# Patient Record
Sex: Female | Born: 1935 | Race: White | Hispanic: No | Smoking: Never smoker
Health system: Southern US, Community
[De-identification: ages and names within clinical notes are randomized; demographics above are authoritative.]

## PROBLEM LIST (undated history)

## (undated) DIAGNOSIS — I50810 Right heart failure, unspecified: Secondary | ICD-10-CM

## (undated) DIAGNOSIS — I82409 Acute embolism and thrombosis of unspecified deep veins of unspecified lower extremity: Secondary | ICD-10-CM

## (undated) DIAGNOSIS — N289 Disorder of kidney and ureter, unspecified: Secondary | ICD-10-CM

## (undated) DIAGNOSIS — R601 Generalized edema: Secondary | ICD-10-CM

## (undated) DIAGNOSIS — E785 Hyperlipidemia, unspecified: Secondary | ICD-10-CM

## (undated) DIAGNOSIS — I10 Essential (primary) hypertension: Secondary | ICD-10-CM

## (undated) DIAGNOSIS — I639 Cerebral infarction, unspecified: Secondary | ICD-10-CM

## (undated) DIAGNOSIS — I4891 Unspecified atrial fibrillation: Secondary | ICD-10-CM

## (undated) DIAGNOSIS — K219 Gastro-esophageal reflux disease without esophagitis: Secondary | ICD-10-CM

## (undated) DIAGNOSIS — N390 Urinary tract infection, site not specified: Secondary | ICD-10-CM

## (undated) DIAGNOSIS — A499 Bacterial infection, unspecified: Secondary | ICD-10-CM

## (undated) DIAGNOSIS — K589 Irritable bowel syndrome without diarrhea: Secondary | ICD-10-CM

## (undated) DIAGNOSIS — R42 Dizziness and giddiness: Secondary | ICD-10-CM

## (undated) DIAGNOSIS — N189 Chronic kidney disease, unspecified: Secondary | ICD-10-CM

## (undated) HISTORY — DX: Acute embolism and thrombosis of unspecified deep veins of unspecified lower extremity: I82.409

## (undated) HISTORY — DX: Dizziness and giddiness: R42

## (undated) HISTORY — DX: Unspecified atrial fibrillation: I48.91

## (undated) HISTORY — DX: Cerebral infarction, unspecified: I63.9

## (undated) HISTORY — DX: Chronic kidney disease, unspecified: N18.9

## (undated) HISTORY — PX: ABDOMINAL HYSTERECTOMY: SHX81

## (undated) HISTORY — DX: Hyperlipidemia, unspecified: E78.5

## (undated) HISTORY — DX: Right heart failure, unspecified: I50.810

## (undated) HISTORY — DX: Urinary tract infection, site not specified: N39.0

## (undated) HISTORY — DX: Bacterial infection, unspecified: A49.9

## (undated) HISTORY — DX: Generalized edema: R60.1

## (undated) HISTORY — DX: Essential (primary) hypertension: I10

## (undated) HISTORY — DX: Gastro-esophageal reflux disease without esophagitis: K21.9

## (undated) HISTORY — DX: Irritable bowel syndrome without diarrhea: K58.9

## (undated) HISTORY — DX: Disorder of kidney and ureter, unspecified: N28.9

---

## 2001-04-12 ENCOUNTER — Encounter: Payer: Self-pay | Admitting: Orthopedic Surgery

## 2001-04-13 ENCOUNTER — Inpatient Hospital Stay (HOSPITAL_COMMUNITY): Admission: RE | Admit: 2001-04-13 | Discharge: 2001-04-16 | Payer: Self-pay | Admitting: Orthopedic Surgery

## 2001-04-16 ENCOUNTER — Inpatient Hospital Stay (HOSPITAL_COMMUNITY)
Admission: RE | Admit: 2001-04-16 | Discharge: 2001-04-23 | Payer: Self-pay | Admitting: Physical Medicine & Rehabilitation

## 2001-04-16 ENCOUNTER — Encounter: Payer: Self-pay | Admitting: Physical Medicine & Rehabilitation

## 2002-08-18 HISTORY — PX: TOTAL KNEE ARTHROPLASTY: SHX125

## 2003-06-16 ENCOUNTER — Ambulatory Visit (HOSPITAL_COMMUNITY): Admission: RE | Admit: 2003-06-16 | Discharge: 2003-06-16 | Payer: Self-pay | Admitting: Specialist

## 2003-07-12 ENCOUNTER — Inpatient Hospital Stay (HOSPITAL_COMMUNITY): Admission: RE | Admit: 2003-07-12 | Discharge: 2003-07-16 | Payer: Self-pay | Admitting: Specialist

## 2003-08-19 HISTORY — PX: BACK SURGERY: SHX140

## 2004-04-17 ENCOUNTER — Inpatient Hospital Stay (HOSPITAL_COMMUNITY): Admission: AD | Admit: 2004-04-17 | Discharge: 2004-04-24 | Payer: Self-pay | Admitting: Specialist

## 2004-04-22 ENCOUNTER — Ambulatory Visit: Payer: Self-pay | Admitting: Physical Medicine & Rehabilitation

## 2004-04-23 ENCOUNTER — Encounter (INDEPENDENT_AMBULATORY_CARE_PROVIDER_SITE_OTHER): Payer: Self-pay | Admitting: Cardiology

## 2004-04-24 ENCOUNTER — Inpatient Hospital Stay (HOSPITAL_COMMUNITY)
Admission: RE | Admit: 2004-04-24 | Discharge: 2004-05-01 | Payer: Self-pay | Admitting: Physical Medicine & Rehabilitation

## 2004-04-24 ENCOUNTER — Ambulatory Visit: Payer: Self-pay | Admitting: Physical Medicine & Rehabilitation

## 2004-06-06 ENCOUNTER — Encounter
Admission: RE | Admit: 2004-06-06 | Discharge: 2004-09-04 | Payer: Self-pay | Admitting: Physical Medicine & Rehabilitation

## 2004-06-10 ENCOUNTER — Ambulatory Visit: Payer: Self-pay | Admitting: Physical Medicine & Rehabilitation

## 2004-09-09 ENCOUNTER — Encounter
Admission: RE | Admit: 2004-09-09 | Discharge: 2004-12-08 | Payer: Self-pay | Admitting: Physical Medicine & Rehabilitation

## 2004-09-11 ENCOUNTER — Ambulatory Visit: Payer: Self-pay | Admitting: Physical Medicine & Rehabilitation

## 2005-07-22 IMAGING — CT CT RECONSTRUCTION
3 of 8 series · 6 of 20 positions shown, 7 images · IV contrast (agent unspecified)
Comparison: none

CLINICAL DATA: Weakness with eversion of the left foot.  History of fusion at L4-5.
 CT SCAN OF THE LUMBAR SPINE WITHOUT CONTRAST:
 Spiral scanning is performed from mid T10 to the sacrum.
 T10-11:  Degeneration of the disk but no suspicion of herniation or stenosis.
 T11-12:  Degeneration of the disk without evidence of herniation or canal stenosis.  Mild neural foraminal narrowing on the left.
 T12-L1:  Advanced degenerative disk disease with vacuum phenomenon and end-plate sclerotic changes.  Retrolisthesis of T12 on L1 of 2 mm.  No significant narrowing of the canal suspected.  Mild neural foraminal narrowing bilaterally, more on the left than the right.
 L1-2:  No disk pathology.  The canal and foramen are widely patent.
 L2-3:  The disk is degenerated and shows a circumferential protrusion eccentrically more prominent towards the left.  The facets and ligaments are hypertrophic.  There is moderate stenosis at this level, most severe in the left lateral recess.
 L3-4:  The patient has a unilateral pars defect on the right.  The pars interarticularis on the left is intact.  There is no anterolisthesis.  The disk shows degenerative change with disk space narrowing and vacuum phenomenon.  There are posteriorly projecting osteophytes.  The facets and ligaments are hypertrophic.  There is artifact at this level from the fusion material at L4-5 but I think there is probably severe spinal stenosis.
 L4-5:  The patient has had posterior decompression with bilateral pedicle screw and posterior rod fixation.  The pedicle screws appear well positioned and I don?t see any gross evidence of loosening.  The central canal is well decompressed.  I don?t see any neural foraminal compromise.
 L5-S1:  There is hypertrophic degenerative facet disease bilaterally, more on the right than on the left.  There is anterolisthesis of L5 on S1 of 2 mm.  The disk bulges mildly.  I don?t think there is any significant stenosis.  I think the L5 nerve roots can exit freely.
 IMPRESSION
 1.  Satisfactory appearance at the fusion level of L4-5.
 2.  Severe spinal stenosis at L3-4 because of degenerative disk disease and hypertrophic degenerative disease of the facets and ligaments.
 3.  Moderate stenosis at L2-3 particularly in the lateral recess, left worse than right, due to protruding disk material and facet ligamentous hypertrophy. 
 CT MULTIPLANAR REFORMATION:
 Sagittal, coronal, and paraxial reformations are done which aid in depiction of the above described findings.

[Series 2: l-spine helical · axial · 0.31mm/px · z∈[-216,-144]mm · 2 of 88 slices shown, 3 images]
[im 30/88  soft-tissue]
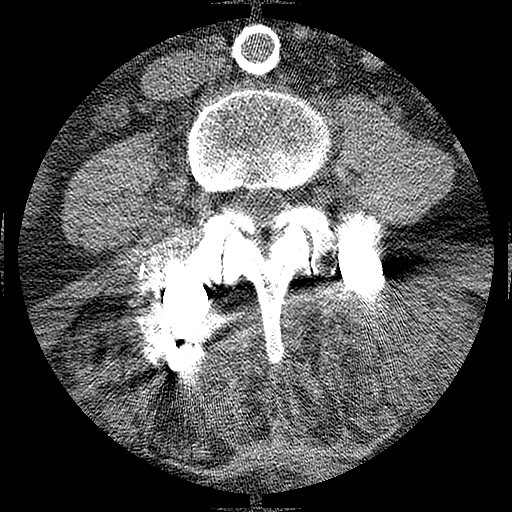
[im 30/88  bone]
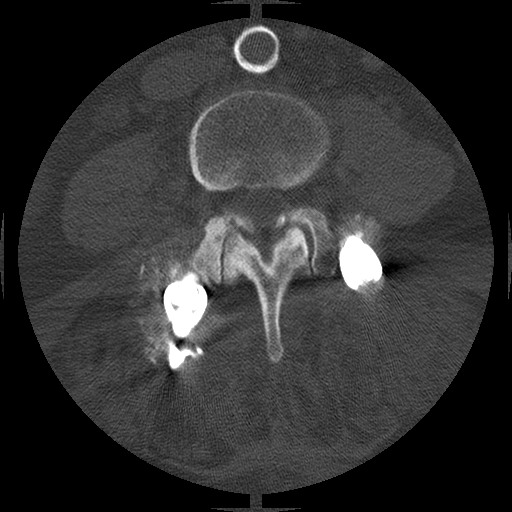
[im 59/88  bone]
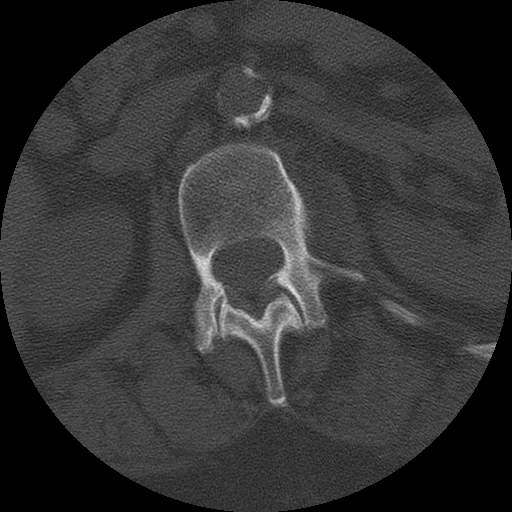

[Series 3: recon 2: l-spine helical · axial · 0.29mm/px · z∈[-216,-144]mm · 2 of 88 slices shown]
[im 30/88  bone]
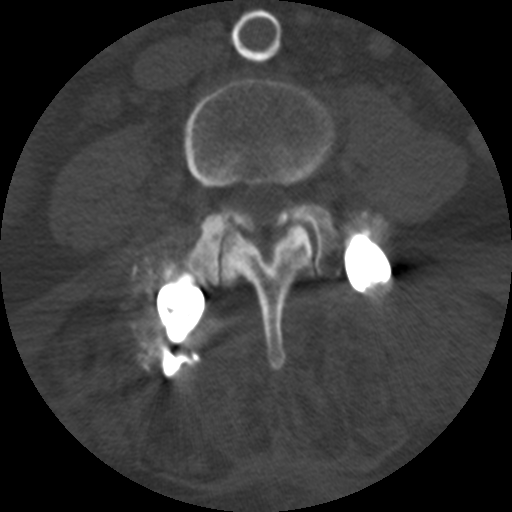
[im 59/88  bone]
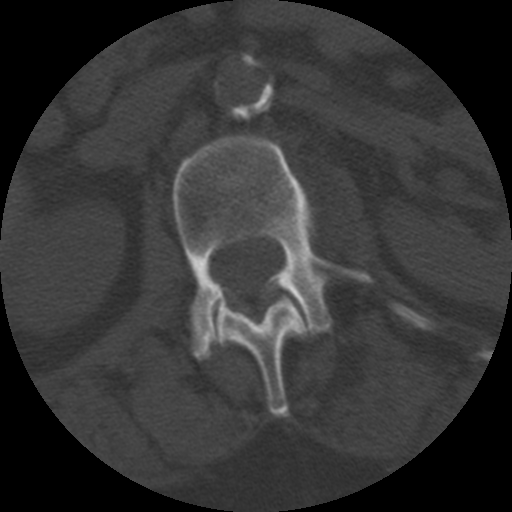

[Series 4: recon 3: l-spine helical · axial · 0.31mm/px · z∈[-216,-144]mm · 2 of 88 slices shown]
[im 30/88  bone]
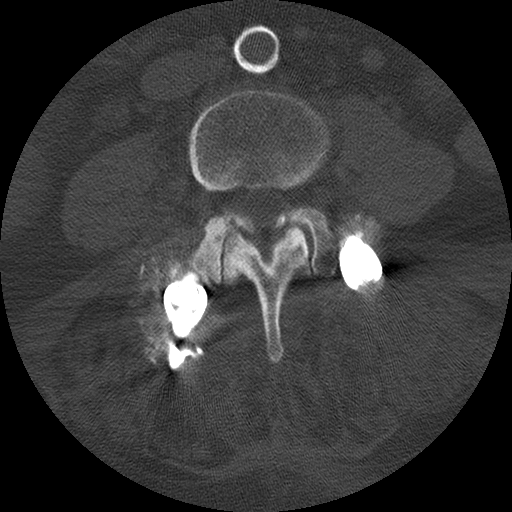
[im 59/88  bone]
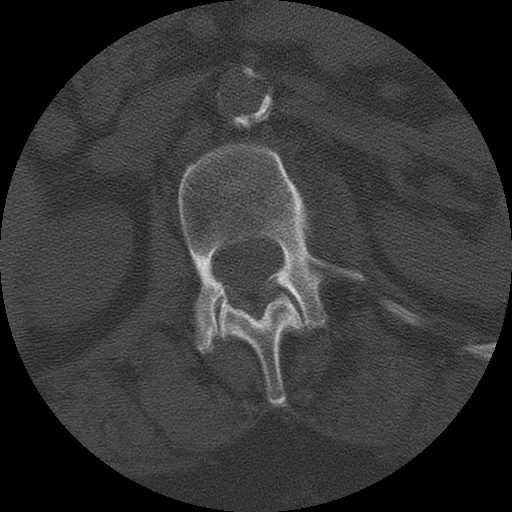

[6 of 20 positions shown; findings below may reference images not displayed]

## 2009-10-02 ENCOUNTER — Ambulatory Visit: Payer: Self-pay | Admitting: Cardiovascular Disease

## 2009-10-23 ENCOUNTER — Telehealth: Payer: Self-pay | Admitting: Cardiovascular Disease

## 2010-01-31 ENCOUNTER — Ambulatory Visit: Payer: Self-pay | Admitting: Vascular Surgery

## 2010-09-17 NOTE — Progress Notes (Signed)
  Phone Note Outgoing Call   Call placed by: Dessie Coma LPN Call placed to: Patient Summary of Call: Patient notified per Dr. Freida Busman, stress test was normal and she should see her neurologist, Dr. Sandria Manly, to f/u on her dizziness.

## 2010-12-31 NOTE — Assessment & Plan Note (Signed)
[facility]                        [place] [facility] OFFICE NOTE   NAME:Fogleman, KATHIA COVINGTON                        MRN:          [id number]  DATE:10/02/2009                            DOB:          09/05/1927    CHIEF COMPLAINT:  Dizziness.   HISTORY OF PRESENT ILLNESS:  Ms. [name] [name] is a 77 year old white female with  past medical history significant for multiple CVAs, hypertension who is  presenting with worsening dizziness.  The patient states that she was  diagnosed with stroke several years ago.  She initially did not have any  dizziness and has felt over the past year or two.  She believes that has  been worse over the past several months.  She states that the dizziness  usually occurs when she stands from a lying or seated position.  She has  had one episode in which she passed out upon standing.  It usually  happens within seconds after standing.  She denies any chest discomfort  or chest pain.  She does endorse increasing dyspnea on exertion to the  point where she can barely walk to her mailbox and back.  She had an  echocardiogram in October 2010, that showed normal ejection fraction and  no significant valvular abnormalities.  She states that the shortness of  breath has been present for over a year but is clearly worse the past  few months.  She denies any previous ischemia or any prior ischemia  evaluation.   PAST MEDICAL HISTORY:  As above in HPI.   SOCIAL HISTORY:  No tobacco.  No alcohol.   FAMILY HISTORY:  Negative for premature coronary artery disease.   ALLERGIES:  The patient has no known drug allergies.   MEDICATIONS:  1. Amlodipine 5 mg daily.  2. Carvedilol 12.5 mg twice a day.  3. Losartan 100 mg daily.  4. Pantoprazole 40 mg daily.  5. Lasix 20 mg daily.  6. Fish oil 1000 mg daily.  7. Ecotrin 81 mg daily.  8. Vitamin D3 2000 IU daily.   REVIEW OF SYSTEMS:  As in HPI.  The patient also endorses some chronic  back and  lower extremity pain.  She also endorses weakness in her lower  extremities since her back surgery.  The patient also notices chronic  edema.  Other systems as in HPI, otherwise negative.   PHYSICAL EXAMINATION:  VITAL SIGNS:  Her blood pressure is 181/82 in the  right arm, 182/77 in the left arm, pulse 68, sating 98% room air.  She  weighs 181 pounds.  Orthostatics were taken here in the office and her  heart rate went from 70 to 84 upon standing.  However, her blood  pressure increased from 152/74 in the lying position, increased  sequentially from sitting to standing in 0, 2, and 5 minutes to  eventually being 199/85.  The patient was symptomatic.  She endorsed  dizziness and shakiness during this time.  GENERAL:  She is no acute distress.  HEENT:  Normocephalic, atraumatic.  NECK:  Supple.  There is no JVD.  There are no carotid bruits.  HEART:  Regular rate and rhythm without murmur, rub, or gallop.  LUNGS:  Clear bilaterally.  ABDOMEN:  Soft, nontender, nondistended.  EXTREMITIES:  1+ bilateral lower extremity edema.  SKIN:  Warm and dry.  PSYCHIATRIC:  The patient is appropriate.   EKG taken today in the office demonstrates normal sinus rhythm with an  incomplete right bundle-branch block.  I reviewed the patient's  echocardiogram, her ejection fraction was 60%, she had aortic valve  sclerosis, peak pulmonary artery pressure of 22 mmHg, and there is mild  tricuspid regurgitation.  MRI of the spine showed multilevel  multifactorial central canal stenosis.  This is most severe at L2-L3 and  L4-L5.  MRI of the brain dated November 2010, there is evidence old  parietal infarctions larger on the left than on the right.  There is  chronic small vessel disease elsewhere.  No acute process.  Ultrasound  of the right lower extremity was negative for DVT in April 2010.  The  patient had a recent blood work drawn at Dr. Donita Brooks office and is  not currently available.   ASSESSMENT:   This is a 77 year old female with a history of  cerebrovascular accidents presenting with dizziness.  I do not think  that the dizziness is neurocardiogenic in origin.  Here in the office,  she was clearly symptomatic with blood pressures that were increasing.  Her dizziness is more likely to be secondary to the CVA's, she has  suffered in the past.  The patient is having dyspnea on exertion of  unclear etiology; it may be secondary to diastolic dysfunction in the  setting of uncontrolled hypertension.  However it could also represent  ischemia.   PLAN:  The patient is on a beta-blocker and this may be contributing to  the dizziness.  Today, we will give her a trial of 7 days of  Bystolic  to take instead of the Coreg.  If the patient's symptoms improve, we  will switch her, if they do not she can resume taking the Coreg.  Because of the increased dyspnea on exertion, we will proceed with  Lexiscan Cardiolite to rule out inducible ischemia.  We advised the  patient make an point with her neurologist, Dr. Sandria Manly who she has not  seen in approximately 2 years for further evaluation and possible  treatment of these symptoms.     Brayton El, MD  Electronically Signed    SGA/MedQ  DD: 10/02/2009  DT: 10/03/2009  Job #: [id number]

## 2010-12-31 NOTE — Procedures (Signed)
DUPLEX DEEP VENOUS EXAM - LOWER EXTREMITY   INDICATION:  Chronic right lower extremity edema, venous insufficiency.   HISTORY:  Edema:  Right lower extremity for over 1 year.  Trauma/Surgery:  History of knee replacement.  Pain:  No.  PE:  No.  Previous DVT:  No.  Anticoagulants:  Other:   DUPLEX EXAM:                CFV   SFV   PopV  PTV    GSV                R  L  R  L  R  L  R   L  R  L  Thrombosis    o  o  o     o     o      o  Spontaneous   +  +  +     +     +      +  Phasic        +  +  +     +     +      +  Augmentation  +  +  +     +     +      +  Compressible  +  +  +     +     +      +  Competent     o  o  +     +     +      +   Legend:  + - yes  o - no  p - partial  D - decreased   IMPRESSION:  1. No evidence of deep vein thrombosis noted in the right lower      extremity.  2. Mild reflux noted, focally, at the bilateral common femoral vein      levels.    _____________________________  Di Kindle. Edilia Bo, M.D.   CH/MEDQ  D:  02/01/2010  T:  02/01/2010  Job:  [id number]

## 2010-12-31 NOTE — Consult Note (Signed)
NEW PATIENT CONSULTATION   [name] [name], [name] [name]  DOB:  21-May-1928                                       01/31/2010  EAVWU#:[id number]   I saw the patient in the office today in consultation concerning  bilateral leg swelling.  She was referred by Dr. Sudie Bailey.  This is a  pleasant 77 year old woman who states that she has had swelling in both  legs for approximately 2 years now.  Over the last 6 months her swelling  has gradually progressed and is more significant on the right side.  Of  note, she underwent a total knee replacement on the right in  approximately 2005.  She cannot remember the exact date.  She has had no  previous history of DVT or phlebitis.  She has had a previous  hysterectomy but has not no other abdominal or inguinal surgery or  radiation therapy.  She states that the swelling is best in the morning  when she wakes up after she has been sleeping and increases gradually  during the day as she is on her feet.  She has minimal pain associated  with the swelling.  She has had no fever or chills.   Her past medical history is significant for hypertension.  She denies  any history of diabetes, hypercholesterolemia, history of previous  myocardial infarction, history of congestive heart failure or history of  COPD.  She did apparently had a stroke in the remote past but cannot  remember the details.  She also has a history of sleep apnea.   PAST SURGICAL HISTORY:  Significant for previous total right knee  replacement.  She has also had a lumbar fusion and a hysterectomy.   FAMILY HISTORY:  There is no history of premature cardiovascular  disease.   SOCIAL HISTORY:  She is widowed.  She has two children.  She does not  smoke cigarettes.  She does not drink alcohol.   REVIEW OF SYSTEMS:  She has had some mild weight loss over the last 6  months.  She has had no fever.  CARDIOVASCULAR:  She has had no chest pain, chest pressure, palpitations  or  arrhythmias.  She does admit to dyspnea on exertion.  She has had no  claudication or rest pain.  She has had no history of TIA or amaurosis  fugax.  GI:  She has a history of reflux and occasional problems swallowing.  NEUROLOGIC:  She has occasional dizziness.  MUSCULOSKELETAL:  She has some arthritis and joint pain.  Pulmonary, GU, psychiatric, ENT, hematologic review of systems is  unremarkable and is documented on the medical history form in her chart.   PHYSICAL EXAMINATION:  General:  This is a pleasant 77 year old woman  who appears her stated age.  Vital signs:  Blood pressure is 167/67,  heart rate is 75, saturation 98%.  HEENT:  Unremarkable.  Lungs:  Are  clear bilaterally to auscultation without rales, rhonchi or wheezing.  Cardiovascular:  I do not detect any carotid bruits.  She has a regular  rate and rhythm.  She has palpable femoral pulses and palpable pedal  pulses on the right.  I cannot palpate pedal pulses on the left arm.  She has brisk Doppler signals in the left foot.  She has palpable  popliteal pulses.  Abdomen:  Soft and normal with normal  pitched bowel  sounds.  No masses are appreciated.  Musculoskeletal:  There are no  major deformities or cyanosis.  She does have mild left lower extremity  swelling with more significant right lower extremity swelling mostly  from the knee down.  This is nonpitting edema.  Neurological:  She has  no focal weakness or paresthesias.  Skin:  There are no ulcers or  rashes.   I did order and independently interpret her venous duplex scan today  which shows no evidence of DVT in the right lower extremity.  She had  some mild reflux in the common femoral veins bilaterally.  Reviewed her  laboratory evaluation which shows a creatinine of 1.28.  White blood  cell count is 7.8.   Based on her history and exam I think she has bilateral lymphedema more  significant on the right side.  She could have some mild underlying  chronic  venous insufficiency.  However I have explained that the  treatment for this would be the same.  We have discussed the importance  of intermittent leg elevation and the proper positioning for this.  In  addition, I have written a prescription for a compression stocking with  a mild gradient of 15-20 mmHg.  She understands that this is a chronic  problem and it is important to try to stay on top of this with daily leg  elevation and compression stockings as needed.  I will be happy to see  her back at any time if any new vascular issues arise.     Di Kindle. Edilia Bo, M.D.  Electronically Signed   CSD/MEDQ  D:  01/31/2010  T:  02/01/2010  Job:  [id number]   cc:   Philemon Kingdom

## 2011-01-03 NOTE — Discharge Summary (Signed)
[name] [name], [name] [name]                 ACCOUNT NO.:  [id number]   MEDICAL RECORD NO.:  [id number]          PATIENT TYPE:  INP   LOCATION:  5010                         FACILITY:  [facility]   PHYSICIAN:  Jene Every, M.D.    DATE OF BIRTH:  06/15/1928   DATE OF ADMISSION:  04/17/2004  DATE OF DISCHARGE:  04/24/2004                                 DISCHARGE SUMMARY   ADMISSION DIAGNOSES:  1.  Weakness in the lower extremities.  2.  Status post right total knee arthroplasty.  3.  Hysterectomy.  4.  Status post lumbar fusion.  5.  Hypertension.  6.  Hyperlipidemia.  7.  Recent urinary tract infection.   DISCHARGE DIAGNOSES:  1.  Lower extremity weakness, status post cerebrovascular accident.  2.  Status post right total knee arthroplasty.  3.  Hysterectomy.  4.  Status post lumbar fusion.  5.  Hypertension.  6.  Hyperlipidemia.  7.  Recent urinary tract infection.   CONSULTATIONS:  PT, OT, rehabilitation, urology.   PROCEDURE:  MRI of the lumbar spine, CT of the head, carotid dopplers.   HISTORY OF PRESENT ILLNESS:  [name] [name] is a 77 year old female who was seen  in the acute care clinic by Dr. Marlowe Kays with complaints of falling  several days ago.  She states she was approaching the door, fell backwards,  and landed on her left side.  The patient was initially seen at [facility] in the emergency room.  The patient states that she had a CT done  which was normal.  She was then sent home.  However, the patient fell again  last evening in her kitchen.  She states now she is unable to walk.  Both  her legs just collapse underneath her.  Her son also points out that her  left foot tends to turn inward.   At this point, Dr. Fayrene Fearing [place] admitted the patient straight from clinic  to the hospital under Dr. Glade Lloyd service for observation and follow  up imaging studies.   LABORATORY DATA:  Admission labs showed a normal white cell count of 8.9,  hemoglobin of  12.8, hematocrit 37.9 with normal differential.  Sedimentation  rate was 13.  Routine chemistries obtained showed a low potassium of 3.0  with a sodium of 141, slightly elevated glucose with a maximum of 135.  Potassium was repeated at the time of discharge and was low at 3.7.  Routine  liver function tests showed slightly elevated AST of 41, hemoglobin A1C of  5.5, homocystine of 16.9.  A lipid profile was obtained which showed  elevated total cholesterol at 222, triglycerides of 306, HDL of 41, LDL of  120.   Angiography of the brain was done and showed extensive atherosclerotic  disease above the anterior and posterior circulation, particularly distal  internal carotid arteries and proximal middle cerebral artery.  There is  suspicion for focal dissection, distal aspect of basilar artery.  Neck  angiography was also performed which showed bilateral vertebral artery  stenoses, at least moderate was noted on the right.  Head CT  was negative  for bleed or acute intracranial process.  Old left distribution infarct was  noted.  X-rays of the cervical spine showed degenerative disc disease of  C5/6 and C6/7.  CT of the lumbar spine showed severe stenosis at L3/4,  moderate stenosis of L2/3 with good fusion at L4/5.  Echocardiogram was also  performed which showed left ventricular systolic function was normal.  Ejection fraction was estimated at 55-65%.  Left atrium was mildly dilated.   HOSPITAL COURSE:  The patient was admitted for observation with further  imaging studies.  A CT of the lumbar spine was ordered.  Hypokalemia was  treated.  PT and OT was consulted.  The patient was fitted with a  ____________ Levonne Hubert.  The patient noted some slight improvement with  application of brace.  Due to negative results of her CT of the lumbar  spine, neurology was consulted for a possible CVA.  A CT of the head showed  questionable old infarct but no acute process was noted.  Neurology followed   along and ordered continued testing.   There is evidence of new right brain stroke.  This was treated accordingly  through neurology.  They ran continued test on the patient.  Rehabilitation  was consulted for continued care.  Medications were adjusted per neurology.  Once they felt she was stable, she was then transferred to the  rehabilitation unit for continued care.   DISPOSITION:  Transferred to rehabilitation for continued care and physical  therapy.  The patient is to follow up with his neurologist as recommended.   DISCHARGE MEDICATIONS:  Resume home medications and any additional  medications that were prescribed during her hospitalization.   CONDITION ON DISCHARGE:  Stable and improved.   DISCHARGE DIAGNOSIS:  Status post new right brain stem infarct.      Chri   CS/MEDQ  D:  05/28/2004  T:  05/28/2004  Job:  [id number]

## 2011-01-03 NOTE — Assessment & Plan Note (Signed)
HISTORY:  The patient is back regarding her right posterior parietal stroke.  We talked about outpatient therapy at the last visit in October, and she  attended this and just finished up two weeks ago.  She feels that it has  helped her balance, but balance is still a major problem.  She uses her cane  when she is up ambulating.  She also has shortness of breath still when  walking.  It is usually after 100 to 200 feet or more.  She had a workup by  her primary care doctor for the shortness of breath, who found no  abnormalities.   Her medications appear to be generally the same today.  She has done some  driving and has been able to do so without any significant problems.  The  patient does complain of some low back pain due to her chronic back history,  including prior surgery.  She rates her pain at a 2 to 3/10 on average.  She  takes Tylenol for relief, which helps to a certain extent.  The pain is made  worse with walking and is relieved with rest.   SOCIAL HISTORY:  The patient lives alone and is widowed.   REVIEW OF SYSTEMS:  The patient reports shortness of breath with exertion,  problems with sleep, but denies any bowel or bladder incontinence, or any  genitourinary complaints.  Denies any problems with weight changes, skin  breakdown, rash, or bruising.  Her mood has been good.   PHYSICAL EXAMINATION:  VITAL SIGNS:  Blood pressure 186/63, pulse 74,  respirations 16.  She is saturating 97% on room air.  GENERAL:  The patient walks with her cane, and tended to lose her balance  more so to the right today, but occasionally did so to the left.  Her loss  of balance was only mild, and the patient seemed to be able to correct  herself rather quickly.  Her affect was bright and her appearance was well-  kempt.  NEUROLOGIC:  Strength was minimally decreased on the left side at 4/5 at the  leg.  She had some decreased sensory function there, as well as some overall  decrease in fine  motor coordination.  Upper extremity strength was near  normal, with probably decreased fine motor coordination, left greater than  right.  Cranial nerve examinations were grossly intact, with good intact  visual fields.  Cognition was generally appropriate.  HEART:  A regular rate and rhythm.  LUNGS:  Clear.  ABDOMEN:  Soft, nontender.   ASSESSMENT:  1.  Status post left posterior parietal cerebrovascular accident.  2.  Status post right total knee replacement.   PLAN:  1.  The patient is fairly stable at this point.  I recommend continued cane      use.  I would like to see her work on her cardiovascular endurance, as      this likely may be her real true problem, as related to her shortness of      breath.  I have recommended going to the [facility] for some scheduled      classes, as well as a generalized program.  She would do well on a      stationary bike and with some aquatic activities.  2.  The patient may continue to drive.  3.  I have nothing further to offer today for this patient.  If her back      pain should persist or worsen, could follow  up with the patient here in our office.  I did give her some Lidoderm      patches to try today for her low back, to see how she liked these.  She      was given #30, 5% Lidoderm patches today.      ZTS/MedQ  D:  09/11/2004 10:34:01  T:  09/11/2004 11:25:50  Job #:  [id number]   cc:   [place]esto Charon, M.D.  [place], [place]

## 2011-01-03 NOTE — Op Note (Signed)
[name], [name]                           ACCOUNT NO.:  [id number]   MEDICAL RECORD NO.:  [id number]                   PATIENT TYPE:  INP   LOCATION:  5032                                 FACILITY:  [facility]   PHYSICIAN:  Jene Every, M.D.                 DATE OF BIRTH:  [birth year]   DATE OF PROCEDURE:  07/12/2003  DATE OF DISCHARGE:                                 OPERATIVE REPORT   PREOPERATIVE DIAGNOSIS:  Spondylolisthesis, L4-5, and spinal stenosis.   POSTOPERATIVE DIAGNOSIS:  Spondylolisthesis, L4-5, and spinal stenosis.   PROCEDURES PERFORMED:  1. Gill laminectomy of L4.  2. Decompression of L4 and L5 nerve roots bilaterally, decompression of L3-4     centrally as well.  3. Instrumentation, L4-5, utilizing pedicle screw instrumentation.  4. Lateral mass fusion utilizing autologous and Symphony bone graft with     Nuvasive free-running electromyographic monitoring for three hours.   ASSISTANT:  __________ .   BRIEF HISTORY AND INDICATIONS:  A 77 year old with spondylolisthesis at L4-  5, severe stenosis, right lower extremity radicular pain.  Operative  intervention was indicated for decompression of L4-5 and stabilization with  lateral mass fusion and possible instrumentation.  Risks and benefits  discussed, including bleeding, infection, damage to neurovascular  structures, CSF leakage, epidural fibrosis, adjacent segment disease, etc.   TECHNIQUE:  Patient in supine position on the Wilson frame, all bony  prominences well-padded and the lumbar region prepped and draped in the  usual sterile fashion.  We used free-running neuro monitoring. An incision  was made from the spinous process of L3 to S1, the subcutaneous tissue was  dissected with electrocautery utilized to achieve hemostasis.  The dorsal  lumbar fascia identified and divided in line with the skin incision.  Paraspinous muscle elevated from the lamina of L4, L5, and S1.  Confirmatory  radiographs obtained  of the L3-4 spinous process and secondarily at L4-5.  The McCullough retractor was placed.  The spinous processes of L4 and L5 and  partially of L3 were removed.  Subcutaneous tissue was __________ ,  electrocautery was utilized to achieve hemostasis.  Entered the interlaminar  space with a 2 mm Kerrison.  Severe stenosis was noted.  This was enlarged  with a 3 mm Kerrison, performing a decompression.  We used osteotomes  laterally at the facet joints.  The Gill fragments were removed.  Superior  articulating facets bilaterally were undercut with the 2 and 3 mm Kerrison,  severe stenosis noted bilaterally, particularly on the right in comparison  to the left.  Foraminotomies of L4 and L5 were performed.  We also then  decompressed and undercut the lamina of L3 and L3-4.  There was an  instability noted.  She had good bone quality.  Therefore, felt that pedicle  screw instrumentation would be appropriate to stabilize L4-5.  Using x-ray  we found the pedicle of L5 and  L4 utilizing an awl and the intersection of  the transverse process and the outer portion of the facet.  Using the  appropriate angulation and convergence, the pedicle probe was utilized to  enter the L4 and the L5 pedicle on the left, then followed by the right.  We  tested it with a neural probing and visualized in the AP and lateral plane  and x-rayed.  Also tested with EMG probe, found to be satisfactory.  We  tapped, and this was performed bilaterally.  A high-speed bur was used to  decorticate the L4 and L5 transverse processes and then the outer portion of  the pars.  Bone graft, autogenous and Symphony bone graft, was then placed  in the lateral recesses.  Forty millimeter 6.5 screws were inserted  bilaterally at L4 and L5 with excellent purchase.  A short 40 mm rod was  utilized to transfix the screws bilaterally with set screws and torqued to  60 foot pounds.  Excellent purchase was noted, and the AP and lateral plane   was found to be satisfactory, converging within the pedicle.  Just prior to  this we tested each of the pedicle screws and the EMG testing and they were  found to be satisfactory.  A hockey stick probe placed in the foramen of L4  and L5 , found to be widely patent following the decompression, and there  was no evidence of CSF leakage or active bleeding.  The wound copiously  irrigated, bipolar electrocautery was utilized to achieve hemostasis.  The  wound copiously irrigated with antibiotic irrigation.  A Hemovac was placed  and brought out through a lateral stab wound in the skin.  We had undercut  the lamina of L3 as well.   Next the dorsal lumbar fascia reapproximated with #1 Vicryl interrupted  figure-of-eight sutures, the subcutaneous tissue reapproximated with 2-0  Vicryl subcuticular sutures, and the skin was reapproximated with staples.  The wound was dressed sterilely.  Placed supine on the hospital bed,  extubated without difficulty, and transported to recovery.   The patient tolerated the procedure well with no complication.  Blood loss  was 650 mL.  She received two units of packed red blood cells  intraoperatively.                                               Jene Every, M.D.    [name] [name]  D:  07/12/2003  T:  07/13/2003  Job:  [id number]

## 2011-01-03 NOTE — H&P (Signed)
NAMELINDSEY, [name] [name]                           ACCOUNT NO.:  [id number]   MEDICAL RECORD NO.:  [id number]                   PATIENT TYPE:  INP   LOCATION:  5010                                 FACILITY:  [facility]   PHYSICIAN:  Jene Every, M.D.                 DATE OF BIRTH:  12/13/1927   DATE OF ADMISSION:  04/17/2004  DATE OF DISCHARGE:                                HISTORY & PHYSICAL   HISTORY OF PRESENT ILLNESS:  This 77 year old lady is seen in the acute care  clinic at [facility] office accompanied by her  son, Jillyn Hidden.  She and he give a history of her falling this past Monday.  She  apparently was approaching a door and fell backwards to the side on the  left.  She was taken to the emergency room at [facility].  CT  scan was done there, which was normal.  She was sent home after that.  She  fell again last evening.  She was in the kitchen area using her walker and  fell backwards.  She states now that she cannot walk as her legs collapse.  Also, her son as well as she has pointed out that her left foot turns  inward.  She essentially steps on her right foot with her left foot when she  ambulates.  She essentially cannot function at home.  She was brought to our  practice today due to the fact that she had had surgery in October of last  year.  This was extensive lumbar surgery, concentrating on the L4-5 area.  The patient is admitted due to the fact that she cannot walk per report and  cannot function.  It is certainly unsafe for her to be at home.  She has had  no loss of bowel or bladder function.   PAST MEDICAL HISTORY:  Surgeries include a right total knee replacement  arthroplasty, hysterectomy, and L4-5 decompression and lumbar laminectomy  with fusion.  She currently is being treated for hypertension and  hyperlipidemia.  She is being treated for a UTI from a urinalysis which was  done at [facility] in [place].   CURRENT MEDICATIONS:  1.  Felodipine ER 10 mg one daily.  2.  Labetalol 200 mg 1-1/2 tablets b.i.d.  3.  Enalapril 10 mg one at h.s.  4.  Cephalexin 500 mg t.i.d. for the urinary tract infection.   She has no medical allergies.   Her physicians are in [place].   SOCIAL HISTORY:  The patient is unemployed and neither smokes nor drinks.   FAMILY HISTORY:  Noncontributory.   REVIEW OF SYSTEMS:  CENTRAL NERVOUS SYSTEM:  No seizure disorder or  paralysis, but the patient has pain in the left lower extremity and exhibits  weakness in the left lower extremity.  CARDIOVASCULAR:  She denies any chest  pain or angina.  RESPIRATORY:  No productive cough, no hemoptysis or  shortness of breath.  GASTROINTESTINAL:  No nausea, vomiting, melena, or  bloody stools.  GENITOURINARY:  No discharge, dysuria, or hematuria despite  her UTI.   PHYSICAL EXAMINATION:  GENERAL:  An alert, cooperative, fully-oriented 78-  year-old who is dressed, well-groomed, and sitting in a wheelchair  accompanied by her son, Jillyn Hidden.  VITAL SIGNS:  Blood pressure 150/62 seated, right arm.  Pulse 80 and  regular.  Respirations 14, unlabored.  HEENT:  Normocephalic, PERRLA.  EOM intact.  Oropharynx is clear.  CHEST:  Clear to auscultation.  No rhonchi, no rales.  CARDIAC:  Regular rate and rhythm, no murmurs are heard.  ABDOMEN:  Soft, nontender, liver and spleen not felt.  GENITALIA, RECTAL, PELVIC, BREASTS:  Not done, not pertinent to present  illness.  EXTREMITIES:  The patient does indeed hold her left foot internally rotated  in wheelchair.  She can, however, dorsiflex the foot at about 4/5 motor  strength.  She can evert the foot somewhat as well.  Straight leg raise is  negative on the left.   ADMISSION DIAGNOSES:  1.  Status post fusion of lumbar spine with most recent surgery to her back      in October 2004.  2.  Near-syncopal episodes.  3.  Hypertension.   PLAN:  The patient will be admitted for  observation, CT scan, and routine x-  rays of the thoracolumbar spine.  She will be admitted to Dr. Ermelinda Das  service.      Dooley L. Cherlynn June.                 Jene Every, M.D.    DLU/MEDQ  D:  04/18/2004  T:  04/18/2004  Job:  [id number]

## 2011-01-03 NOTE — Procedures (Signed)
[name]  Patient:    [name], [name] Visit Number: [id number] MRN: [id number]          Service Type: SUR Location: 3000 3033 01 Attending Physician:  Drema Pry Dictated by:   Guadalupe Maple, M.D. Proc. Date: 04/13/01 Adm. Date:  04/13/2001                             Procedure Report  PROCEDURE:  Lumbar epidural catheter placement for postoperative pain control.  ANESTHESIOLOGIST:  Guadalupe Maple, M.D.  INDICATION:  Ms. [name] [name] is a 77 year old white female who underwent a right total knee arthroplasty by Dr. Jearld Adjutant under general anesthesia. Prior to the procedure, the risks and benefits of epidural pain control were discussed with the patient and she agreed to proceed with this form of pain control following surgery.  DESCRIPTION OF PROCEDURE:  Upon completion of the procedure, while the patient was still under general endotracheal anesthesia, she was turned to the right lateral decubitus position and the low back was prepped and draped sterilely. Two attempts were made to enter the epidural space at the L3-4 interspace, which were unsuccessful due to an inability to pass between the bony laminae. A third attempt at L2-3 was successful using an 18-gauge Tuohy needle in the midline.  Loss-of-resistance technique with air was used.  Aspiration of the needle was negative for blood or CSF.  Ten cc of preservative-free saline to which were added 50 mg of 1% lidocaine and 100 mcg of Fentanyl were injected without difficulty.  A catheter was then threaded 4 cm into the epidural space and the needle was removed without difficulty.  The catheter was then taped securely in place and the patient was turned to the supine position and brought to the recovery room in stable condition.  She will then be begun on an epidural Fentanyl and Marcaine infusion and followed on a daily basis by the anesthesia service.  Dictated by:    Guadalupe Maple, M.D. Attending Physician:  Drema Pry DD:  04/13/01 TD:  04/14/01 Job: [id number]

## 2011-01-03 NOTE — Discharge Summary (Signed)
[place]. [facility]  Patient:    [name], [name] Visit Number: [id number] MRN: [id number]          Service Type: Attending:  Faith [name], M.D. Dictated by:   [name] [name], P.A. Adm. Date:  04/16/01 Disc. Date: 04/23/01   CC:         Dr. Modesto [name]  [name] [name], M.D.   Discharge Summary  DISCHARGE DIAGNOSES: 1. Status post right total knee arthroplasty. 2. Hypertension. 3. Anemia. 4. Osteoporosis.  HISTORY OF PRESENT ILLNESS:  The patient is a 77 year old, white female with past medical history of hypertension and CVA admitted on April 13, 2001, for right total knee arthroplasty secondary to end-stage OA by Dr. Doristine [name]. Postoperative complications included anemia secondary to blood loss and increased temperatures with blood pressure fluctuations.  The patient is presently not on any blood pressure medications.  She is on Coumadin for DVT prophylaxis.  PT report indicated that the patient was ambulating with minimal assistance 40 feet with standard walker.  She could transfer sit to stand with minimal to modified assist.  The patient is weightbearing as tolerated.  She received two units of packed red blood cells for anemia.  Primary M.D. is Dr. Modesto [name].  PAST MEDICAL HISTORY: 1. Hypertension. 2. Cardiovascular disease. 3. Hypercholesterolemia. 4. Depression. 5. Insomnia. 6. Diverticulitis. 7. CVA.  PAST SURGICAL HISTORY:  TAH.  MEDICATIONS: 1. Neurontin 300 mg one tablet b.i.d. 2. Captopril 25 mg one tablet t.i.d. 3. HCTZ 25 mg q.a.m. 4. Aspirin 325 mg q.d. 5. Os-Cal plus D two tablets daily.  SOCIAL HISTORY:  The patient is a widow and lives alone in [place] in one-level home.  She has two steps to entry.  She was independent prior to admission.  No alcohol or tobacco use.  Her son can assist and is very supportive.  REVIEW OF SYSTEMS:  Significant for increased blood pressure and joint pain.  HOSPITAL COURSE:   [name] [name] was admitted to rehabilitation on April 16, 2001, where she received at least three hours of PT/OT daily.  The first two days were significant for elevated temperatures, anemia, hyperkalemia and elevated blood pressures.  Due to increased temperatures, chest x-ray was ordered which was negative.  Blood cultures and urine cultures came back negative.  The patient was encouraged to push incentive spirometry.  Temperatures also began to normalize and urine cultures also came back negative.  The patient was transfused two units of packed red blood cells on Saturday on April 18, 2001, due to decreased hemoglobin.  The patient was also placed on Trinsicon p.o. b.i.d.  Due to the increased blood pressure, the patient was restarted on Lasix 25 mg p.o. q.d. and Captopril 25 mg p.o. t.i.d.  Blood pressure became to normalize.  The patient was started on K-Dur 20 mEq p.o. b.i.d. secondary to hypokalemia secondary to diuretics.  The patient remained on Coumadin for DVT prophylaxis with supplement of Lovenox 30 mg q.12h. until INR reached therapeutic level.  The patient also began to complain of night sweats and leg swelling.  Venous duplex was ordered on April 21, 2001, which came back negative for DVT and Bakers cyst.  Due to continued increased night sweats, a TSH level was ordered which was within normal limits.  We suspected that night sweats were caused from the pain medication.  The patient made very good progress while in rehabilitation.  Latest labs indicated that the patients hemoglobin was 10.7, hematocrit 31.1, white count 7.7.  Sodium was 140, potassium 4.0, CO2 34, glucose 115, BUN 15, creatinine 0.8.  TSH was within normal limits.  At the time of discharge, vital signs were stable.  PT report indicates the patient was modified independent ambulating 150 feet with standard walker and with knee immobilizer.  She could transfer sit to stand with modified independence.   The patient is weightbearing as tolerated.  Latest chest x-ray was done on April 16, 2001, and demonstrated to be negative for any active disease.  Wound demonstrated no signs of infection.  Staples were removed and Steri-Strips were applied.  The wound demonstrated no signs of infection.  The patient was discharged home with son and family.  DISCHARGE MEDICATIONS: 1. Captopril 25 mg one tablet three times a day. 2. HCTZ 25 mg daily. 3. Os-Cal 500 plus D two tablets daily. 4. Aspirin, do not take while on Coumadin. 5. Oxycodone 5 mg one to two tablets every four to six hours as needed for    pain. 6. Trinsicon one tablet twice daily. 7. Coumadin 5 mg one tablet daily x 18 days.  ACTIVITY:  Use walker for knee precautions.  No driving.  Continue to use knee immobilizer.  DIET:  Low sodium diet.  SPECIAL INSTRUCTIONS:  The patient will receive [facility] with PT/OT and nursing to monitor Coumadin.  First draw will be on Monday, April 26, 2001.  FOLLOWUP:  Follow up with Dr. Doristine [name] within two weeks.  Follow up with Dr. Modesto [name] within four weeks.  Follow up with Dr. Riley [name] as needed.Dictated by:   [name] [name], P.A. Attending:  Faith [name], M.D. DD:  04/23/01 TD:  04/25/01 Job: [id number]

## 2011-01-03 NOTE — Discharge Summary (Signed)
NAMEROSELAND, BRAUN                           ACCOUNT NO.:  [id number]   MEDICAL RECORD NO.:  [id number]                   PATIENT TYPE:  IPS   LOCATION:  4029                                 FACILITY:  [facility]   PHYSICIAN:  Ranelle Oyster, M.D.             DATE OF BIRTH:  1928-07-16   DATE OF ADMISSION:  04/24/2004  DATE OF DISCHARGE:  05/01/2004                                 DISCHARGE SUMMARY   DISCHARGE DIAGNOSES:  1.  Subacute right posteroparietal infarction.  2.  Subcutaneous Lovenox for deep venous thrombosis prophylaxis.  3.  Hypertension.  4.  Recent urinary tract infection.  5.  History of lumbar fusion, November 2004.   HISTORY OF PRESENT ILLNESS:  [age]-year-old right-handed white  female, history of hypertension, admitted, April 17, 2004, with falls and  back pain, noted recent lumbar fusion, November 2004, initially seen at  [facility], cranial CT scan negative; she was sent home.  Progressive  gait disorder, workup with thoracic/lumbar spine showing no disk pathology  or fracture, neurology, Dr. Genene Churn. Love, CT of the head with old left  distribution infarction, MRI with questionable subacute infarction, right  posteroparietal territory, carotid Duplex negative,  echocardiogram with  ejection fraction of 55% to 65%, transcranial Dopplers were pending, placed  on aspirin and subcutaneous Lovenox, admitted for a comprehensive rehab  program.   PAST MEDICAL HISTORY:  See discharge diagnoses.   ALLERGIES:  None.   HABITS:  No alcohol or tobacco.   MEDICATIONS PRIOR TO ADMISSION:  1.  Labetalol 200 mg 1-1/2 tablets twice daily.  2.  Vasotec.  3.  Plendil.   SOCIAL HISTORY:  She lives alone, unemployed, 1-level home, 2 steps to  entry, 2 local sons that work day shift.   HOSPITAL COURSE:  Patient with progressive gains while on rehab services  with therapies initiated on a b.i.d. basis.  The following issues were  followed during patient's  rehab course:  Pertaining to Mrs. Schwinn's right  middle cerebral artery parietal branch infarction, remained stable,  maintained on aspirin therapy.  She was ambulating with a walker, modified  independent.  Home therapies would be arranged.  She had no bowel or bladder  disturbances, no swallowing deficits.  She was advised no driving.  She  remained on subcutaneous Lovenox throughout the rehab stay for deep venous  thrombosis prophylaxis.  Blood pressure was maintained on Vasotec, Normodyne  and Plendil; she had no orthostatic changes.  She did complete a course of  Cipro for a urinary tract infection; she denied any dysuria or hematuria.  Followup urinalysis study was negative.  She had a history of a lumbar  fusion in November 2004.  Workup during this recent hospital stay showed no  disk pathology or fracture.  She was using a moist heating pad overall for  her comfort and Tylenol for pain.  Overall strength and endurance continued  to  improve.  She was encouraged with her overall progress and discharged to  home.   Latest labs showed a urinalysis study -- no growth; hemoglobin 11.6,  hematocrit 33.3; sodium 137, potassium 4.4, BUN 30, creatinine 1.0.   DISCHARGE MEDICATIONS:  1.  Plendil 10 mg daily.  2.  Vasotec 10 mg daily.  3.  Ecotrin 325 mg daily.  4.  Zocor 20 mg daily.  5.  Foltx daily.  6.  Normodyne 300 mg twice daily.   ACTIVITY:  Activity as tolerated.   DIET:  Regular.   SPECIAL INSTRUCTIONS:  No driving.  Home therapies as advised.   FOLLOWUP:  She would follow up with Dr. Ranelle Oyster at the outpatient  rehab service office as advised.      Mariam Dollar, P.A.                     Ranelle Oyster, M.D.    DA/MEDQ  D:  04/30/2004  T:  04/30/2004  Job:  [id number]   cc:   Genene Churn. Love, M.D.  [address]  [address]  [place]  [place] [address]  Fax: [phone]   Modesto Charon  [address]    [place] [address]  Fax: [phone]   [facility]

## 2011-01-03 NOTE — Discharge Summary (Signed)
[place]. [facility]  Patient:    [name] [name], [name] [name] Visit Number: [id number] MRN: [id number]          Service Type: [facility] Location: 4100 4149 01 Attending Physician:  Faith Rogue T Dictated by:   Darien Ramus, P.A.-C. Admit Date:  04/16/2001 Discharge Date: 04/23/2001                             Discharge Summary  DISCHARGE DIAGNOSIS:  Right total knee replacement with cemented components, Depuy rotating platform.  PROCEDURES:  The patient underwent a right total knee replacement with cemented components on 04/13/01.  ALLERGIES:  No known drug allergies.  HISTORY OF PRESENT ILLNESS:  The patient has had gradual onset of pain for a considerable length of time, and has failed conservative treatment.  PAST MEDICAL HISTORY: 1. Hysterectomy in 1978. 2. Small stroke about 5 years ago.  SOCIAL HISTORY:  The patient does not smoke or use alcohol.  FAMILY HISTORY:  Significant for cancer, cerebrovascular accidents, and arthritis.  HOSPITAL COURSE:  The patient was admitted to [facility] on 04/13/01, where she underwent a right total knee replacement with cemented components by Depuy and with a rotating platform.  The patient tolerated the procedure well, transferred to the post-anesthesia care unit in stable condition.  She followed an extremely normal recovery, and transferred to physical therapy on 04/16/01.  CONDITION ON DISCHARGE:  Stable.  DISCHARGE MEDICATIONS:  She is to resume preoperative medications.  Then such medications as deemed necessary by physical therapy.  LABORATORY DATA:  From 04/15/01, white blood cell count 11.9, RBC 3.07, hemoglobin 9.4, hematocrit 26.9, PT 14.7, INR 1.2.  WOUND CARE:  The patient is to wear her knee immobilizer for six weeks, and then any other instructions per physical therapy. Dictated by:   Darien Ramus, P.A.-C. Attending Physician:  Faith Rogue T DD:  04/29/01 TD:  04/29/01 Job:  [id number]

## 2011-01-03 NOTE — Op Note (Signed)
[place]  Patient:    [name] [name] Visit Number: [id number] MRN: [id number]          Service Type: DSU Location: 5000 5020 01 Attending Physician:  Drema Pry Proc. Date: 04/13/01 Adm. Date:  04/13/2001   CC:         Dr. Arlan Organ in [facility]   Operative Report  PREOPERATIVE DIAGNOSIS:  Degenerative joint disease, end-stage, right knee with valgus.  POSTOPERATIVE DIAGNOSIS:  Degenerative joint disease, end-stage, right knee with valgus.  PROCEDURE: 1. Right total knee arthroplasty using cemented Depuy components with rotating    platform. 2. Medial reefing of medial collateral ligament.  SURGEON:  Jearld Adjutant, M.D.  ASSISTANT:  Darien Ramus, P.A.-C.  ANESTHESIA:  General endotracheal.  CULTURES:  None.  DRAINS:  Two medium Hemovacs and Autovac.  ESTIMATED BLOOD LOSS:  Less than 100 cc.  REPLACEMENT:  Without.  TOURNIQUET TIME:  One hour and 32 minutes.  PATHOLOGIC FINDINGS AND HISTORY:  The patient is a 77 year old female who presented to me with inability to walk on her right knee.  She had significant valgus, essentially 20 degrees with valgus thrust.  On knee exam, she had valgus to about 15 degrees, slightly laxed ligaments of varus valgus stressing in full extension with severe crepitation of patellofemoral and tibiofemoral. X-rays showed bone-on-bone in the lateral compartment with valgus.  She had some opening of the medial joint line on stance.  At surgery, we found marked degenerative of the lateral compartment, but also patellofemoral and medial. The lateral collateral ligamentous complex was released, but it still did not balance well due to the stretch on the medial side.  To achieve full balance and extension and flexion, we used a 12.5 mm insert, but we had to tighten it down, and use two super Mitek anchors on the tibial side medially and one on the femoral side with the #2 Ethibond  sutures going through the collateral, tightening it up as the sutures passed, and then setting the tension in extension and tying down the knots.  This achieved only a slight amount of give in full extension to valgus stressing.  Overall good alignment of the knee and about 45 degrees with excellent medial collateral ligament stability with balance and full extension and flexion at 12.5 mm.  We used a standard right femur, a size #3 tibia, a standard 12.5 mm insert, a 35 mm patella, and used cement with Zinacef Depuy-type cement, two batches with two 750 mg doses of Zinacef.  We did not need to do a retinacular release.  The range of motion at closure was 0-95.  DESCRIPTION OF PROCEDURE:  With adequate anesthesia obtained using endotracheal technique, 1 g of Ancef was given IV prophylaxis, and another one at tourniquet let down.  The patient was placed in the supine position.  The right lower extremity was prepped from the toes to the tourniquet in the standard fashion.  After standard prepping and draping, Esmarch exsanguination was used and the tourniquet was let up to 350 mmHg.  A medium parapatellar skin incision was followed by a medial parapatellar retinacular incision.  The incision was deepened sharply with the knife and hemostasis obtained using the Bovie electrocoagulator.  We then excised the soft tissues off the tibia including the menisci and both cruciates, and the fat pad.  The patella was everted and the knee flexed.  The anterior tibial spine was ________.  We then placed the gold-tip  drill down the canal, placed the tibial cutting jig in place with the appropriate alignment, and then made our tibial cut 10 degrees posterior.  At this point, we sized the femur to a standard plus anterior-posterior cutting jig in place, set it in rotation with a 12.5 pendant manner anterior-posterior cuts.  We then had balance and flexion at 12.5, but in extension, she was tight laterally  and a little loose medially, so I did some release on the lateral side.  I then with the lollipop still in place, the 12.5 did the procedure that I described above, tightening down the medial collateral ligament with the Mitek sutures and achieved soft tissue ligament balance with this technique, in full extension, and then she still balanced with flexion at 12.5.  Anterior-posterior chamfer cutting jig was then put in place as well as the notch cutting jig, and those cuts made.  I then exposed the proximal tibial size to a 3, made a central PEG hole and then the keel hole.  I then placed a 12.5 mm insert and a femoral trial and articulated the knee to a full range of motion with full extension and flexion as listed.  I then used the patellar cutting jig set for a 35, and made our depth cut.  I had to actually cut it a bit more than the original markings to get a flat surface.  I then placed the template on for the 35 mm three-hole pegs, made those holes, and then placed the trial on with good tracking of the prosthesis noted.  All trial components were then removed.  Thorough jet lavage carried out where the components were checked for sizing as they came on the field. Cement was then mixed with Zinacef and a cement gun.  We then exposed the proximal tibia, cemented the tibial tray with some cement down the canal, impacted it, removed excess cement.  We then placed on the rotating platform and then cemented on the femoral component, impacted it, and removed excess cement.  I extended the knee to full extension, squeezing out cement further, and removed excess cement.  I then held the knee in 45 degrees of flexion while the cement cured.  As we cemented on the patellar component, impacted it, and removed excess cement.  When the cement had hardened, the tourniquet was let down.  Bleeding points were cauterized.  We then used some additional jet lavage.  Hemovac drains were brought out the  superolateral portal into the medial and lateral gutters,  and the wound was then closed in layers with #1 Vicryl on the retinaculum, 0 and 2-0 Vicryl on the subcutaneous, and skin staples.  A bulky sterile compressive dressing was applied with knee immobilization. The patient having tolerated the procedure well was awakened and taken to the recovery room in satisfactory condition for routine postoperative care and CPM. Attending Physician:  Drema Pry DD:  04/13/01 TD:  04/14/01 Job: [id number]

## 2011-01-03 NOTE — Consult Note (Signed)
[name] [name], [name] [name]                           ACCOUNT NO.:  [id number]   MEDICAL RECORD NO.:  [id number]                   PATIENT TYPE:  INP   LOCATION:  5010                                 FACILITY:  [facility]   PHYSICIAN:  Genene Churn. Love, M.D.                 DATE OF BIRTH:  [birth year]   DATE OF CONSULTATION:  04/20/2004  DATE OF DISCHARGE:                                   CONSULTATION   REASON FOR CONSULTATION:  This 77 year old right-handed white female is seen  at the request of Dr. Jene Every for evaluation of gait disorder.   HISTORY OF PRESENT ILLNESS:  [name] [name] has a history of left brain stroke  occurring approximately 5 years ago, evaluated and treated by her personal  physician as an outpatient.  She noticed difficulty with her speech at that  time and some right-sided weakness.  At that time, she was discovered to  have high blood pressure.  She has not been on aspirin therapy.  She was at  home in her usual state of health on Monday, April 16, 2004, and at about 7  p.m. a friend heard her fall in her home.  He came in to find her lying face  down with her right arm jerking and she was weak in her left arm and in her  left leg.  Subsequently, as she aroused she was able to say I'm alright,  I'm alright.  There was no urinary or bowel incontinence.  The patient has  no recollection of why she went unconscious.  She denies any preceding  warning of chest pain, palpitations, or a visual loss or focal neurologic  symptoms.  Despite the fact she has had a stroke, she has been independent  of her activities of daily living.  She has had no focal weakness and she  has been able to drive a car.  She does not smoke cigarettes, have a history  of diabetes mellitus, or a known history of coronary artery disease.  Since  the fall on April 16, 2004, she has had difficulty walking.  When standing,  she feels that her legs are weak.  She was seen by the orthopedist because  of past  history of lumbar spine disease.  The patient feels that she has a  back problem causing her not to be able to walk.   PAST MEDICAL HISTORY:  Significant for a total right knee replacement,  hysterectomy, an L4-L5 decompression with lumbar laminectomy and fusion, a  left brain stroke in 1995, and hypertension.   PHYSICAL EXAMINATION:  GENERAL:  A well-developed white female.  VITAL SIGNS:  Blood pressure right arm 100/60, left arm 100/60, standing  blood pressure in her right arm was 120/60, heart rate was 85.  No bruits  were heard.  NECK:  Flexion and extension maneuvers were unremarkable.  NEUROLOGICAL:  Mental Status:  She  was alert.  Oriented to person, to place,  and to year and month.  She knew the president but not the vice president or  the governor.  Her disks were flat.  The extraocular movements were full.  She had a left inferior quadrantanopsia.  There was no seventh nerve palsy.  The tongue was midline, the uvula was midline, and gags were present. A  motor examination revealed left foot inverse and weakness.  She had 2+  reflexes at her knees, absent ankle jerks, and upgoing plantar responses  bilaterally.  She could stand but had a very unsteady gait.   IMPRESSION:  1.  A gait disorder (Code 781.2).  2.  Seizure by history (Code 345.10).  3.  History of acute right brain stroke and findings on examination      compatible with right brain stroke (Code 434.1).  4.  Old left brain stroke (Code 434.01).  5.  Hypertension (Code 796.2).   PLAN:  The plan at this time is to obtain an MRI of the brain, MRA, and  place the patient on seizure precautions.  A CT scan of the brain was  reviewed today at [facility] showing evidence of old left brain stroke, mostly in  the middle cerebral artery axis and a high right parietal stroke.                                               Genene Churn. Sandria Manly, M.D.    JML/MEDQ  D:  04/20/2004  T:  04/22/2004  Job:  [id number]   cc:   Jene Every,  M.D.  [address]  [place]  [place] [address]  Fax: [phone]

## 2011-01-03 NOTE — Discharge Summary (Signed)
[name] [name], [name] [name]                           ACCOUNT NO.:  [id number]   MEDICAL RECORD NO.:  [id number]                   PATIENT TYPE:  INP   LOCATION:  5032                                 FACILITY:  [facility]   PHYSICIAN:  Jene Every, M.D.                 DATE OF BIRTH:  [birth year]   DATE OF ADMISSION:  07/12/2003  DATE OF DISCHARGE:  07/16/2003                                 DISCHARGE SUMMARY   ADMISSION DIAGNOSES:  1. Degenerative spondylolisthesis, L4-L5.  2. Hypertension.   DISCHARGE DIAGNOSES:  1. Spondylolisthesis, L4-L5, status post decompression with lateral mass     adhesion at L3-L4 and L4-L5 with instrumentation at L4-L5.  2. Hypertension.  3. Postoperative anemia.  4. Urinary tract infection (UTI).   PROCEDURES:  The patient was taken to the OR on July 12, 2003, to  undergo lumbar decompression with lateral mass adhesion at L3-L4 and L4-L5.   SURGEON:  Dr. Jene Every.   ASSISTANT:  Roma Schanz, P.A.-C, and Vira Browns, M.D.   ANESTHESIA:  General.   COMPLICATIONS:  None.   DRAINS:  One Hemovac drain was placed at the time of surgery.   ESTIMATED BLOOD LOSS:  700 ml  The patient was transfused two units packed red blood cells  intraoperatively.   CONSULTS:  PT and OT.   HISTORY:  [name] [name] is a 77 year old female with severe low back pain and  right lower extremity pain.  The patient has undergone multiple conservative  treatments including ES-5 and analgesics, as well as activity modifications.  However, her pain pattern continues to progress and is very disabling in  nature.  X-rays and MRI do show severe spondylosis at L4-L5 with severe  stenosis causing right lower extremity radiculopathy.  It was felt at this  time that the patient will benefit from a lumbar fusion.  The risks and  benefits of the surgery were discussed with the patient and she wishes to  proceed.   LABORATORY DATA:  Preoperative laboratory data shows a white blood  cell  count of 6.7, hemoglobin 13.1, hematocrit 39.  Routine H&Hs were followed  throughout the hospital course.  The patient did have a rise in her white  blood cell count of 13.5; however, at the time of discharge, it has dropped  to a level of 10.1.  She did drop her hemoglobin to 10.0 and hematocrit  29.0; however, at the time of discharge, this had stabilized with a  hemoglobin of 11.5 and hematocrit 33.4.  Routine chemistries done  preoperatively showed a sodium of 140 and potassium 4.2 with a normal  glucose.  During the hospital stay, the patient's sodium and potassium  remained normal with a sodium of 138 and potassium of 4.4 at the time of  discharge.  Urinalysis done during the hospital stay showed trace leukocyte  esterase and few epithelial with 36 WBCs with hyaline casts  noted.  On  microscopic exam, blood type is O positive.   Preoperative chest x-ray, I do not see in the chart.   A preoperative EKG shows normal sinus rhythm with right bundle branch block  noted.   HOSPITAL COURSE:  The patient was taken to the OR and underwent the above  stated procedure.  Estimated blood loss was 700 mL.  Two units of packed red  blood cells were transferred.  The patient was placed in a step-down unit;  however, no beds were available at this time.  The patient was transferred  to 5000.  On day number one, the patient was doing very well.  She noted  pain in the back as expected.  She was passing flatus.  She had a slight  temperature at 99.1.  The Hemovac drain was pulled without difficulty.  The  Foley and PCA was discontinued.  Diet was fully advanced.  PT/OT were  consulted.  The patient did spike a temp of 101.2.  She had stabilization in  her hemoglobin at 11.6, hematocrit 32.4.  Incentive spirometer was  encouraged.  The patient was passing flatus.  Her diet was advanced.  Her  temperature had resolved.  The incision had mild serosanguineous drainage  but no signs of infection.   On postoperative day number four, the patient  continued to do very well.  She had a bowel movement.  Dressing remained  dry.  She was afebrile.  Neurovascular and motor function was intact.  Urinalysis was obtained which showed a UTI.  The patient was started on  Cipro times three days.  It was felt at this point that her pain was well  controlled on her p.o. analgesics and the patient could be discharged home.   DISPOSITION:  The patient will be discharged home with family care.  She  will return to see Dr. Shelle Iron in approximately 10-14 days.  She is given a  prescription for Talacen, Robaxin, Ambien, and Cipro.  She is to follow up  with her medical doctor in regards to her urinary tract infection.  She is  to resume all home medications.  She is to be out of bed with the brace.  She is to avoid any bending, twisting, or lifting.   CONDITION ON DISCHARGE:  Stable and improved.  She is to change her dressing  on a daily basis.      Roma Schanz, P.A.                   Jene Every, M.D.    CS/MEDQ  D:  07/30/2003  T:  07/30/2003  Job:  [id number]

## 2011-01-03 NOTE — H&P (Signed)
[name] [name], [name] [name]                           ACCOUNT NO.:  [id number]   MEDICAL RECORD NO.:  [id number]                   PATIENT TYPE:  AMB   LOCATION:  DAY                                  [facility]ACILITY[facility]  [facility]   PHYSICIAN:  Jene Every, M.D.                 DATE OF BIRTH:  Aug 12, 1928   DATE OF ADMISSION:  06/16/2003  DATE OF DISCHARGE:                                HISTORY & PHYSICAL   CHIEF COMPLAINT:  Bilateral lower extremity pain.   HISTORY OF PRESENT ILLNESS:  [name] [name] is a 77 year old female who was  initially treated by Caralyn Guile. Ramos, M.D., for an uncertain period of time  after being diagnosed with a grade II degenerative spondylolisthesis, which  has progressed since her previous exam.  The patient noted significant back  pain with lower extremity radiculopathy that is worse with standing, better  with sitting.  Pain has progressively gotten worse, limiting her activities  of daily living as well as her ambulatory range, especially in the right  lower extremity.  The patient had previous ESIs in the past that were short-  lasting as well as treated with analgesics without any significant relief of  her symptoms.  MRI does show severe multifactorial stenosis L4-5 with grade  I listhesis as well as facet arthropathy at L5-S1 and S3-4.  It is felt, due  to the disabling nature of the patient's spinal stenosis and the fact that  she has failed conservative treatment, that she would benefit from a  decompression at the L4-5 level.  The risks and benefits of this surgery  were discussed with the patient and she wishes to proceed.   PAST MEDICAL HISTORY:  1. Hyperlipidemia.  2. Hypertension.   CURRENT MEDICATIONS:  1. Pravachol 80 mg one half p.o. q.h.s.  2. Aspirin one p.o. daily.  3. Labetalol 200 mg one p.o. b.i.d.   ALLERGIES:  No known drug allergies.   PAST SURGICAL HISTORY:  1. Right total knee arthroplasty.  2. Hysterectomy.   SOCIAL HISTORY:  The patient  is widowed.  Her family will help take care of  her following surgery.  She denies any tobacco or alcohol intake.   FAMILY HISTORY:  Mother deceased with coronary artery disease.  Father  deceased of prostate cancer.   REVIEW OF SYSTEMS:  GENERAL: The patient denies any fever, chills, night  sweats, or bleeding tendencies.  CNS: No blurred, double vision, seizure,  headache, or paralysis.  RESPIRATORY: The patient does have dyspnea on  exertion which is well known to her primary care physician.  No shortness of  breath, productive cough, or hemoptysis is noted.  CARDIOVASCULAR: No chest  pain, angina, or orthopnea.  GU: No dysuria, hematuria, or discharge.  GI:  No nausea, vomiting, diarrhea, constipation, melena, or bloody stools.  MUSCULOSKELETAL: Pertinent to HPI.   PHYSICAL EXAMINATION:  VITAL SIGNS:  Pulse 80,  respiratory 16, blood  pressure 190/94.  However, previously taken at home, she states her blood  pressure was 128/80.  GENERAL:  This is a well developed, well nourished 77 year old female in  mild to moderate distress.  She is obese in nature.  HEENT:  Atraumatic, normocephalic.  Pupils are equal, round, and reactive to  light.  EOM: Intact.  NECK: Supple with no lymphadenopathy.  CHEST:  Clear to auscultation bilaterally.  No rhonchi, wheezes, or rales  are noted.  HEART:  Regular rate and rhythm without murmurs, gallops, or rubs.  ABDOMEN:  Soft, nontender, nondistended, bowel sounds x 4.  BREAST/GU:  Not examined, not pertinent to HPI.  SKIN:  No rashes or lesions are noted.  EXTREMITIES:  The patient does well with antalgic gait.  She has straight  leg raise bilaterally that produced buttock pressure without any calf pain.  EHL is 5/-5.  Quad strength is 5/-5 as well.   IMPRESSION:  Severe spinal stenosis at L4-5 with grade II spondylolisthesis.   PLAN:  The patient will be admitted to [facility] to undergo  lumbar decompression at the L4-5 level.  She  did receive medical clearance  from [facility] in [place] in regard to her hypertension.       Roma Schanz, P.A.                   Jene Every, M.D.    CS/MEDQ  D:  06/13/2003  T:  06/13/2003  Job:  [id number]

## 2011-01-03 NOTE — Procedures (Signed)
The patient was recorded upon request of Dr. Avie Echevaria who saw her in  neurological consultation.   REFERRING PHYSICIAN:  Dr. Leandra Kern.   This is a 16-channel EEG recording with 1 channel representing heart rate  and rhythm exclusively in the International 10/20 placement system.  Activation procedures included photic stimulation.   MEDICATIONS:  Felodipine ER 10 mg once a day, labetalol 200 mg 1-1/2 tablets  b.i.d., enalapril 10 mg, and cephalexin.   PAST SURGICAL HISTORY:  Surgeries include total right knee replacement,  hysterectomy, L4-L5 decompression lumbar laminectomy.   PAST MEDICAL HISTORY:  Further history positive for hypertension,  hyperlipidemia, frequent UTIs. The patient resides in [place].   A posterior dominant rhythm of 8 to 8-1/2 hertz emanates from both posterior  hemispheres symmetrically. As the study progresses, there is evidence of  amplitude suppression in the central right hemisphere, but the occipital  region remains symmetric between left and right. The amplitude appears  mostly suppressed in the central region of the right hemisphere, C4 and T4  namely.   The EKG documents mainly movement artifact but regular beats per minute. No  epileptiform discharges can be seen after photic stimulation. No photic  entrainment is seen.   This is an abnormal study showing right sided amplitude suppression but  preserved bilateral posterior dominant rhythms. The question of a right  hemispheric underlying insult is raised. Clinical correlation is  recommended.    Melvyn Novas, M.D.   ZO:XWRU  D:  04/23/2004 16:11:58  T:  04/24/2004 09:15:12  Job #:  [id number]

## 2011-04-15 ENCOUNTER — Encounter: Payer: Self-pay | Admitting: Cardiovascular Disease

## 2011-05-07 ENCOUNTER — Encounter: Payer: Self-pay | Admitting: Cardiovascular Disease

## 2011-06-18 ENCOUNTER — Encounter: Payer: Self-pay | Admitting: Internal Medicine

## 2011-07-16 ENCOUNTER — Ambulatory Visit: Payer: Self-pay | Admitting: Internal Medicine

## 2011-07-30 ENCOUNTER — Encounter: Payer: Self-pay | Admitting: Cardiovascular Disease

## 2011-08-08 ENCOUNTER — Encounter: Payer: Self-pay | Admitting: Internal Medicine

## 2011-08-08 ENCOUNTER — Ambulatory Visit (INDEPENDENT_AMBULATORY_CARE_PROVIDER_SITE_OTHER): Payer: Medicare Other | Admitting: Internal Medicine

## 2011-08-08 VITALS — BP 148/62 | HR 60 | Ht 62.0 in | Wt 149.0 lb

## 2011-08-08 DIAGNOSIS — K529 Noninfective gastroenteritis and colitis, unspecified: Secondary | ICD-10-CM

## 2011-08-08 DIAGNOSIS — R197 Diarrhea, unspecified: Secondary | ICD-10-CM

## 2011-08-08 DIAGNOSIS — I50810 Right heart failure, unspecified: Secondary | ICD-10-CM

## 2011-08-08 DIAGNOSIS — I509 Heart failure, unspecified: Secondary | ICD-10-CM

## 2011-08-08 DIAGNOSIS — Z7902 Long term (current) use of antithrombotics/antiplatelets: Secondary | ICD-10-CM

## 2011-08-08 NOTE — Progress Notes (Addendum)
Subjective:    Patient ID: [name] [name], female    DOB: Jan 09, 1928, 77 y.o.   MRN: [id number]  HPI She was hospitalized twice in [facility] in the summer - last in August - had edema and leakage through the skin of the legs and then had acute renal insufficiency due to over diuresis. Diarrhea after she left the hospital. Usually a daily occurrence, urgent, some incontinence and some nocturnal stools. She has had some sort of stool studies performed and it sounds like she has had stool studies for C diff twice though I do not have those records. She was taking pantoprazole (x years) and since it might cause diarrhea and is using as needed and the diarrhea improved but now it is worse. Stools are watery, and are at least twice a day for the last month. Appetite was off but better with improved renal function. She has lost weight and most thought from edema. Colonoscopy 6 years ago routine ([facility]) with small polyp. Initially had abdominal cramps but not so much lately. She is here with daughter-in-law who participates in the history.  She is a neighbor of [employer], one of my patients.  No Known Allergies Outpatient Prescriptions Prior to Visit  Medication Sig Dispense Refill  . aspirin 81 MG tablet Take 81 mg by mouth daily.        . fish oil-omega-3 fatty acids 1000 MG capsule Take by mouth daily.       . furosemide (LASIX) 20 MG tablet Take 20 mg by mouth 2 (two) times daily.       . pantoprazole (PROTONIX) 40 MG tablet Take 40 mg by mouth daily.        Marland Kitchen amLODipine (NORVASC) 5 MG tablet Take 5 mg by mouth daily.        . carvedilol (COREG) 12.5 MG tablet Take 12.5 mg by mouth 2 (two) times daily with a meal.        . cholecalciferol (VITAMIN D) 1000 UNITS tablet Take 2,000 Units by mouth daily.        Marland Kitchen losartan (COZAAR) 100 MG tablet Take 100 mg by mouth daily.        . Multiple Vitamin (MULTIVITAMIN) tablet Take 1 tablet by mouth daily.         Past Medical History  Diagnosis  Date  . CVA (cerebral vascular accident)     multiple  . HTN (hypertension)   . Dizziness   . Irritable bowel syndrome   . Hyperlipemia   . GERD (gastroesophageal reflux disease)   . Anasarca   . Atrial fibrillation   . Right-sided heart failure   . DVT (deep venous thrombosis)   . Acute on chronic renal insufficiency   . Urinary tract infection, bacterial    Past Surgical History  Procedure Date  . Total knee arthroplasty 2004  . Back surgery 2005    lumbar fusion  . Abdominal hysterectomy    History   Social History  . Marital Status: Widowed    Spouse Name: N/A    Number of Children: 2  . Years of Education: N/A   Occupational History  . Retired    Social History Main Topics  . Smoking status: Never Smoker   . Smokeless tobacco: Never Used  . Alcohol Use: No  . Drug Use: No  . Sexually Active: None   Other Topics Concern  . None   Social History Narrative   Caffeine daily    Family History  Problem Relation Age of Onset  . Heart disease Mother   . Colon cancer Father         Review of Systems balance problems Back pain and spinal stenosis    Objective:   Physical Exam General:  Well-developed, well-nourished and in no acute distress Eyes:  anicteric. ENT:   Mouth and posterior pharynx free of lesions.  Missing teeth Neck:   supple w/o thyromegaly or mass.  Lungs: Clear to auscultation bilaterally. Heart:  S1S2, no rubs, murmurs, gallops.no JVD Abdomen:  soft, non-tender, no hepatosplenomegaly, hernia, or mass and BS+.  Rectal: Lymph:  no cervical or supraclavicular adenopathy. Extremities:   Trace ankle edema right > left Neuro:  A&O x 3.  Psych:  appropriate mood and  Affect.   Data Reviewed: After her visit I received records from hospitalizations.  Admission 03/08/2011, with discharge 03/13/2011 Lower extremity edema and anasarca. Diagnosed with diastolic in right-sided heart failure based on echocardiogram. Was found to have atrial  fibrillation, collected to treat with Plavix and aspirin because of concern about falls at home, so warfarin, Pradaxa not used.  Admission 04/02/2011 ending 04/05/2011. Acute on chronic renal insufficiency and urinary tract infection and dehydration. She was thought to have a new DVT then as well. Treated with ciprofloxacin.       Assessment & Plan:  She has 4 months of diarrhea of unclear etiology at this point. Since she was hospitalized and exposed to antibiotics, Clostridium difficile colitis seems possible. It sounds like she has been tested for this. The right heart failure raises the question of some sort of restrictive cardiomyopathy possibly though we don't have good evidence for that based upon the available information i.e. there is no evidence of  pericardial calcifications in chest x-ray reports, echocardiogram did not overtly suggest that though I don't know if they were looking for any type of problem like that so it could have been missed. This is, admittedly quite rare but her diarrhea did not start until she had problems with the heart failure and the hospitalizations. Her albumin was 4.3 on 04/02/2011, that goes against right heart failure and lymphatic obstruction as a cause of her diarrhea.   I requested records, and after she left I was able to review the [facility] records. I am awaiting the stool studies and office records. She may very well need a colonoscopy. She takes Plavix and I think it would be best to hold that if she underwent a colonoscopy. The patient and her daughter-in-law distend this, I reviewed the risks benefits and indications of the procedure. The understand him awaiting records review before final determination. If she has not had a Clostridium difficile for PCR testing I would do that before any invasive studies.  He will use Imodium when necessary. She's not really tried any over-the-counter agents. She is still having the diarrhea something is  different but she does not seem to be in that much distress. She's had weight loss though most of that was probably from diuresis. We'll keep in mind that medications could be causing this. I think Plavix actually could cause diarrhea in some patients as well. She thought her pantoprazole might be contributing though she started using that intermittently and did notice a difference. Furosemide has been known to cause diarrhea as well.   [facility] office records show that she had a negative C diff toxin. Electrolytes and albumin have been normal 05/2011, CBC was normal also  We will have her do a  C diff by PCR and if negative then probably pursue colonoscopy

## 2011-08-08 NOTE — Patient Instructions (Signed)
Try Imodium OTC up to 4 times per day. In the anticipation of having to do a Colonoscopy. You will be contacted by our office prior to your procedure for directions on holding your Plavix.  If you do not hear from our office 1 week prior to your scheduled procedure, please call [phone] to discuss.  We will request your records from your physicians for review and we will contact your with further instructions/appointments.

## 2011-08-13 ENCOUNTER — Telehealth: Payer: Self-pay | Admitting: Gastroenterology

## 2011-08-13 NOTE — Telephone Encounter (Signed)
Message copied by Bernita Buffy on Wed Aug 13, 2011  4:21 PM ------      Message from: Prim Morace, [place] A      Created: Fri Aug 08, 2011  2:05 PM       See if we received a letter from Dr. Daryll Brod concerning the stopping of patient's Plavix prior to procedure, in the anticipation that patient needs one. Procedure haven't been scheduled yet until Dr. Leone Payor reviews records.

## 2011-08-13 NOTE — Telephone Encounter (Signed)
Medical records release faxed to [facility] on 08/08/11. Received a call today stating that no records were found on this patient.

## 2011-08-13 NOTE — Telephone Encounter (Signed)
Received a letter from Dr. Irma Newness office stating they have no file of patient on Plavix and suggested that I contact Dr. Woodfin Ganja (Cardiologist). So letter sent to Dr. Woodfin Ganja.

## 2011-08-20 ENCOUNTER — Telehealth: Payer: Self-pay

## 2011-08-20 ENCOUNTER — Other Ambulatory Visit: Payer: Self-pay

## 2011-08-20 DIAGNOSIS — R197 Diarrhea, unspecified: Secondary | ICD-10-CM

## 2011-08-20 NOTE — Telephone Encounter (Signed)
I have contacted the patient about doing c-diff by PCR.  She will come by one day this week and pick up the containers

## 2011-08-20 NOTE — Telephone Encounter (Signed)
Message copied by Annett Fabian on Wed Aug 20, 2011  9:53 AM ------      Message from: Stan Head E      Created: Mon Aug 18, 2011  2:10 PM      Regarding: needs C diff PCR       I routed an addended note to you            I want her to do a C diff by PCR and a fecal lactoferrin re: diarrhea

## 2011-08-22 ENCOUNTER — Telehealth: Payer: Self-pay | Admitting: Gastroenterology

## 2011-08-22 NOTE — Telephone Encounter (Signed)
Message copied by Bernita Buffy on Fri Aug 22, 2011 12:50 PM ------      Message from: Tedra [place] A      Created: Wed Aug 13, 2011  4:29 PM       Make sure I receive a letter from Dr. Woodfin Ganja concerning the stopping of patient's Plavix 5-7 days prior to procedure that hasn't been scheduled as of yet.

## 2011-08-22 NOTE — Telephone Encounter (Signed)
Letter sent electronically on 08/13/11 concerning patient's Plavix but haven't received a reply. Letter printed and faxed to Dr. Primitivo Gauze office. Waiting on a reply.

## 2011-08-26 NOTE — Telephone Encounter (Signed)
Received a letter from Dr. Primitivo Gauze office Jaynie Bream the stopping of Plavix 7 days prior to any procedure the patient may need. Procedure hasn't been scheduled as of yet. Call patient and inform her when procedure is scheduled.

## 2013-07-11 ENCOUNTER — Telehealth: Payer: Self-pay | Admitting: Neurology

## 2014-08-16 NOTE — Telephone Encounter (Signed)
error 

## 2014-12-17 DEATH — deceased
